# Patient Record
Sex: Male | Born: 1950 | Race: White | Hispanic: No | Marital: Married | Smoking: Never smoker
Health system: Southern US, Community
[De-identification: ages and names within clinical notes are randomized; demographics above are authoritative.]

## PROBLEM LIST (undated history)

## (undated) DIAGNOSIS — M109 Gout, unspecified: Secondary | ICD-10-CM

## (undated) DIAGNOSIS — I10 Essential (primary) hypertension: Secondary | ICD-10-CM

## (undated) DIAGNOSIS — M1990 Unspecified osteoarthritis, unspecified site: Secondary | ICD-10-CM

## (undated) DIAGNOSIS — Z87442 Personal history of urinary calculi: Secondary | ICD-10-CM

## (undated) HISTORY — PX: APPENDECTOMY: SHX54

## (undated) HISTORY — PX: TONSILLECTOMY: SUR1361

## (undated) HISTORY — PX: NISSEN FUNDOPLICATION: SHX2091

## (undated) HISTORY — PX: HERNIA REPAIR: SHX51

---

## 1998-11-17 HISTORY — PX: DG CALCANEOUS HEEL LEFT (ARMC HX): HXRAD1465

## 2019-05-05 ENCOUNTER — Other Ambulatory Visit: Payer: Self-pay

## 2019-05-05 ENCOUNTER — Ambulatory Visit
Admission: EM | Admit: 2019-05-05 | Discharge: 2019-05-05 | Disposition: A | Discharge status: Home / Self Care | Payer: Medicare Other | Attending: Emergency Medicine | Admitting: Emergency Medicine

## 2019-05-05 DIAGNOSIS — N200 Calculus of kidney: Secondary | ICD-10-CM | POA: Diagnosis not present

## 2019-05-05 DIAGNOSIS — R030 Elevated blood-pressure reading, without diagnosis of hypertension: Secondary | ICD-10-CM

## 2019-05-05 DIAGNOSIS — R109 Unspecified abdominal pain: Secondary | ICD-10-CM | POA: Diagnosis not present

## 2019-05-05 HISTORY — DX: Essential (primary) hypertension: I10

## 2019-05-05 LAB — POCT URINALYSIS DIP (MANUAL ENTRY)
Bilirubin, UA: NEGATIVE
Glucose, UA: NEGATIVE mg/dL
Ketones, POC UA: NEGATIVE mg/dL
Leukocytes, UA: NEGATIVE
Nitrite, UA: NEGATIVE
Protein Ur, POC: NEGATIVE mg/dL
Spec Grav, UA: 1.015 (ref 1.010–1.025)
Urobilinogen, UA: 0.2 E.U./dL
pH, UA: 7 (ref 5.0–8.0)

## 2019-05-05 MED ORDER — TRAMADOL HCL 50 MG PO TABS: 50.0000 mg | ORAL_TABLET | Freq: Two times a day (BID) | ORAL | 0 refills | Status: AC | PRN

## 2019-05-05 MED ORDER — ONDANSETRON HCL 4 MG PO TABS: 4.0000 mg | ORAL_TABLET | Freq: Four times a day (QID) | ORAL | 0 refills | Status: AC

## 2019-05-05 MED ORDER — KETOROLAC TROMETHAMINE 60 MG/2ML IM SOLN
60.0000 mg | Freq: Once | INTRAMUSCULAR | Status: AC
  Administered 2019-05-05: 60 mg via INTRAMUSCULAR

## 2019-05-05 MED ORDER — IBUPROFEN 800 MG PO TABS: 800.0000 mg | ORAL_TABLET | Freq: Two times a day (BID) | ORAL | 0 refills | Status: AC | PRN

## 2019-05-05 NOTE — ED Provider Notes (Addendum)
[facility]   CC: Flank pain  SUBJECTIVE:  Tyler Steele is a 68 y.o. male hx significant for HTN, who complains of left sided flank pain that began this morning.  Admits to decreased water intake over night while working.  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  Localizes the pain to the left flank.  Pain is constant and dull, and intermittently sharp.  Has tried ibuprofen 600 mg 2-3 hours ago without relief. Pain is 10/10.  Admits to similar symptoms in the past related to kidney stones.  Complains of 1 episode of nausea, and vomiting that occurred this morning secondary to pain.  Denies fever, chills, abdominal pain, hematuria, dysuria, oligoria, changes in bowel movements.    Last BM this morning and normal for patient.    LMP: No LMP for male patient.  ROS: As in HPI.  Past Medical History:  Diagnosis Date  . Hypertension    Past Surgical History:  Procedure Laterality Date  . APPENDECTOMY    . HERNIA REPAIR    . NISSEN FUNDOPLICATION    . TONSILLECTOMY     Allergies  Allergen Reactions  . Penicillins    No current facility-administered medications on file prior to encounter.    Current Outpatient Medications on File Prior to Encounter  Medication Sig Dispense Refill  . losartan (COZAAR) 25 MG tablet Take 25 mg by mouth daily.    . ALLOPURINOL PO Take by mouth.    Marland Kitchen. amLODipine (NORVASC) 5 MG tablet Take 5 mg by mouth daily.    Marland Kitchen. DICLOFENAC PO Take by mouth.     Social History   Socioeconomic History  . Marital status: Married    Spouse name: Not on file  . Number of children: Not on file  . Years of education: Not on file  . Highest education level: Not on file  Occupational History  . Not on file  Social Needs  . Financial resource strain: Not on file  . Food insecurity    Worry: Not on file    Inability: Not on file  . Transportation needs    Medical: Not on file    Non-medical: Not on file  Tobacco Use  . Smoking  status: Never Smoker  . Smokeless tobacco: Never Used  Substance and Sexual Activity  . Alcohol use: Not Currently  . Drug use: Never  . Sexual activity: Not on file  Lifestyle  . Physical activity    Days per week: Not on file    Minutes per session: Not on file  . Stress: Not on file  Relationships  . Social Musicianconnections    Talks on phone: Not on file    Gets together: Not on file    Attends religious service: Not on file    Active member of club or organization: Not on file    Attends meetings of clubs or organizations: Not on file    Relationship status: Not on file  . Intimate partner violence    Fear of current or ex partner: Not on file    Emotionally abused: Not on file    Physically abused: Not on file    Forced sexual activity: Not on file  Other Topics Concern  . Not on file  Social History Narrative  . Not on file   History reviewed. No pertinent family history.  OBJECTIVE:  Vitals:   05/05/19 1338  BP: (!) 173/93  Pulse: 74  Resp: 20  Temp:  98.1 F (36.7 C)  SpO2: 96%   General appearance: Alert; appears uncomfortable, but nontoxic HEENT: NCAT.  Oropharynx clear.  Lungs: clear to auscultation bilaterally without adventitious breath sounds Heart: regular rate and rhythm.  Abdomen: soft; non-distended; TTP over left flank; bowel sounds present; + guarding Back: no CVA tenderness Extremities: no edema; symmetrical with no gross deformities Skin: warm and dry; 3 - 4 cm transverse scar over RLQ, healed Neurologic: Ambulates from chair to exam table without difficulty Psychological: alert and cooperative; normal mood and affect  Labs Reviewed  POCT URINALYSIS DIP (MANUAL ENTRY) - Abnormal; Notable for the following components:      Result Value   Blood, UA small (*)    All other components within normal limits   Results for orders placed or performed during the hospital encounter of 05/05/19 (from the past 24 hour(s))  POCT urinalysis dipstick      Status: Abnormal   Collection Time: 05/05/19  2:01 PM  Result Value Ref Range   Color, UA yellow yellow   Clarity, UA clear clear   Glucose, UA negative negative mg/dL   Bilirubin, UA negative negative   Ketones, POC UA negative negative mg/dL   Spec Grav, UA 1.015 1.010 - 1.025   Blood, UA small (A) negative   pH, UA 7.0 5.0 - 8.0   Protein Ur, POC negative negative mg/dL   Urobilinogen, UA 0.2 0.2 or 1.0 E.U./dL   Nitrite, UA Negative Negative   Leukocytes, UA Negative Negative     ASSESSMENT & PLAN:  1. Kidney stone   2. Left flank pain   3. Elevated blood pressure reading     Meds ordered this encounter  Medications  . ketorolac (TORADOL) injection 60 mg  . ibuprofen (ADVIL) 800 MG tablet    Sig: Take 1 tablet (800 mg total) by mouth 2 (two) times daily as needed for moderate pain.    Dispense:  30 tablet    Refill:  0    Order Specific Question:   Supervising Provider    Answer:   Raylene Everts [[id number]]  . ondansetron (ZOFRAN) 4 MG tablet    Sig: Take 1 tablet (4 mg total) by mouth every 6 (six) hours.    Dispense:  12 tablet    Refill:  0    Order Specific Question:   Supervising Provider    Answer:   Raylene Everts [[id number]]  . traMADol (ULTRAM) 50 MG tablet    Sig: Take 1 tablet (50 mg total) by mouth every 12 (twelve) hours as needed for severe pain.    Dispense:  10 tablet    Refill:  0    Order Specific Question:   Supervising Provider    Answer:   Raylene Everts [[id number]]   Urine did not show signs of infection, but did show small blood Toradol shot given in office Push fluids Strain all urine and follow up with PCP with specimen in [place] prescribed.  Use as needed for nausea.  Ibuprofen 800 mg twice daily.  Avoid taking with other antiinflammatories as this may cause GI upset and/or bleed.   Tramadol for severe break-through pain.  This medication may make you drowsy DO NOT TAKE prior to driving or operating heavy machinery  Return or go to ER if you have any new or worsening symptoms (difficulty urinating, blood in urine, pain that does not moderate with medication, fever, chills, nausea, vomiting, abdominal pain, etc...)   Blood pressure elevated  in office.  Please recheck in 24 hours.  If it continues to be greater than 140/90 please follow up with PCP for further evaluation and management.    Outlined signs and symptoms indicating need for more acute intervention. Patient verbalized understanding. After Visit Summary given.     Rennis HardingWurst, Charm Stenner, PA-C 05/05/19 1413    Alvino ChapelWurst, [place]ittany, PA-C 05/05/19 1414

## 2019-05-05 NOTE — Discharge Instructions (Addendum)
Urine did not show signs of infection, but did show small blood Toradol shot given in office Push fluids Strain all urine and follow up with PCP with specimen in [place] prescribed.  Use as needed for nausea.  Ibuprofen 800 mg twice daily.  Avoid taking with other antiinflammatories as this may cause GI upset and/or bleed.   Tramadol for severe break-through pain.  This medication may make you drowsy DO NOT TAKE prior to driving or operating heavy machinery Return or go to ER if you have any new or worsening symptoms (difficulty urinating, blood in urine, pain that does not moderate with medication, fever, chills, nausea, vomiting, abdominal pain, etc...)   Blood pressure elevated in office.  Please recheck in 24 hours.  If it continues to be greater than 140/90 please follow up with PCP for further evaluation and management.

## 2019-05-05 NOTE — ED Triage Notes (Signed)
Pt has left sided flank pain, began this morning, has h/o kidney stones

## 2019-05-08 ENCOUNTER — Encounter (HOSPITAL_COMMUNITY): Payer: Self-pay | Admitting: Emergency Medicine

## 2019-05-08 ENCOUNTER — Emergency Department (HOSPITAL_COMMUNITY)
Admission: EM | Admit: 2019-05-08 | Discharge: 2019-05-08 | Disposition: A | Discharge status: Home / Self Care | Payer: Medicare Other | Attending: Emergency Medicine | Admitting: Emergency Medicine

## 2019-05-08 ENCOUNTER — Emergency Department (HOSPITAL_COMMUNITY): Payer: Medicare Other

## 2019-05-08 ENCOUNTER — Other Ambulatory Visit: Payer: Self-pay

## 2019-05-08 DIAGNOSIS — I10 Essential (primary) hypertension: Secondary | ICD-10-CM | POA: Diagnosis not present

## 2019-05-08 DIAGNOSIS — R109 Unspecified abdominal pain: Secondary | ICD-10-CM

## 2019-05-08 DIAGNOSIS — Z79899 Other long term (current) drug therapy: Secondary | ICD-10-CM | POA: Diagnosis not present

## 2019-05-08 DIAGNOSIS — N200 Calculus of kidney: Secondary | ICD-10-CM | POA: Diagnosis not present

## 2019-05-08 DIAGNOSIS — R1012 Left upper quadrant pain: Secondary | ICD-10-CM | POA: Diagnosis present

## 2019-05-08 LAB — URINALYSIS, ROUTINE W REFLEX MICROSCOPIC
Bilirubin Urine: NEGATIVE
Glucose, UA: NEGATIVE mg/dL
Hgb urine dipstick: NEGATIVE
Ketones, ur: NEGATIVE mg/dL
Leukocytes,Ua: NEGATIVE
Nitrite: NEGATIVE
Protein, ur: NEGATIVE mg/dL
Specific Gravity, Urine: 1.012 (ref 1.005–1.030)
pH: 5 (ref 5.0–8.0)

## 2019-05-08 MED ORDER — DIPHENHYDRAMINE HCL 25 MG PO CAPS
25.0000 mg | ORAL_CAPSULE | Freq: Once | ORAL | Status: AC
  Administered 2019-05-08: 25 mg via ORAL
  Filled 2019-05-08: qty 1

## 2019-05-08 MED ORDER — TAMSULOSIN HCL 0.4 MG PO CAPS: 0.4000 mg | ORAL_CAPSULE | Freq: Every day | ORAL | 0 refills | Status: AC

## 2019-05-08 MED ORDER — OXYCODONE-ACETAMINOPHEN 5-325 MG PO TABS
1.0000 | ORAL_TABLET | Freq: Once | ORAL | Status: AC
  Administered 2019-05-08: 1 via ORAL
  Filled 2019-05-08: qty 1

## 2019-05-08 MED ORDER — OXYCODONE-ACETAMINOPHEN 5-325 MG PO TABS: 1.0000 | ORAL_TABLET | ORAL | 0 refills | Status: AC | PRN

## 2019-05-08 NOTE — Discharge Instructions (Addendum)
Your CT scan showed a 9 mm kidney stone.  You will need to follow-up with your urologist when you return home.  Take a benadryl capsule 25 mg 15-20 minutes before taking your percocet if the percocet causes itching.  Take the flomax as directed.  Return to ER for any worsening symptoms such as difficulty urinating, fever, or increasing pain

## 2019-05-08 NOTE — ED Triage Notes (Signed)
Pain in last 3-4 days, left flank pain.  Seen at Urgent Care, treated with Mortin and Toradol with mild relief.  Last dose of meds was 10:30.

## 2019-05-08 NOTE — ED Notes (Signed)
Pt never looks up from cell phone from which he is texting   Reports "kidney stones" when asked complaint  Reports he is here from [place] on a job and has a kidney stone   Urine collected and to lab

## 2019-05-08 NOTE — ED Notes (Signed)
TT in to reassess pt

## 2019-05-08 NOTE — ED Provider Notes (Signed)
[facility] EMERGENCY DEPARTMENT Provider Note   CSN: [id number] Arrival date & time: 05/08/19  1457     History   Chief Complaint Chief Complaint  Patient presents with  . Flank Pain    left    HPI Ladona MowStevie Dolliver is a 68 y.o. male.     HPI   Ladona MowStevie Shutt is a 68 y.o. male with a hx of HTN and kidney stones, he comes to the Emergency Department complaining of recurrent left flank pain.  He was [facility] at [facility] care on 05/05/2019 for similar symptoms.  He was given injection of Toradol and prescription for 800 mg ibuprofen he states that since taking the medication and receiving the injection his pain has been much improved.  This morning, he states the pain returned.  He describes the pain as constant in his mid to lower left flank area.  Pain is nonradiating.  He took 1 dose of ibuprofen this morning at 1030 with some relief.  He is here working and resides in [place].  He reports history of recurrent kidney stones and states his current pain feels similar.  He denies known injury, fever, chills, dysuria, hematuria, abdominal pain and vomiting.  Pain is not associated with movement.    Past Medical History:  Diagnosis Date  . Hypertension     There are no active problems to display for this patient.   Past Surgical History:  Procedure Laterality Date  . APPENDECTOMY    . HERNIA REPAIR    . NISSEN FUNDOPLICATION    . TONSILLECTOMY        Home Medications    Prior to Admission medications   Medication Sig Start Date End Date Taking? Authorizing Provider  allopurinol (ZYLOPRIM) 300 MG tablet Take 300 mg by mouth daily.    Yes [provider]  atorvastatin (LIPITOR) 20 MG tablet Take 20 mg by mouth every morning.   Yes [provider]  diclofenac (VOLTAREN) 50 MG EC tablet Take 50 mg by mouth daily. *May take twice daily   Yes [provider]  fluticasone (FLONASE) 50 MCG/ACT nasal spray Place 2 sprays into both nostrils 2  (two) times a day.   Yes [provider]  ibuprofen (ADVIL) 800 MG tablet Take 1 tablet (800 mg total) by mouth 2 (two) times daily as needed for moderate pain. 05/05/19  Yes Wurst, [place], PA-C  levocetirizine (XYZAL) 5 MG tablet Take 5 mg by mouth every evening.   Yes [provider]  losartan (COZAAR) 25 MG tablet Take 25 mg by mouth at bedtime.  07/08/18  Yes [provider]  montelukast (SINGULAIR) 10 MG tablet Take 10 mg by mouth at bedtime.   Yes [provider]  ondansetron (ZOFRAN) 4 MG tablet Take 1 tablet (4 mg total) by mouth every 6 (six) hours. 05/05/19  Yes Wurst, [place], PA-C  traMADol (ULTRAM) 50 MG tablet Take 1 tablet (50 mg total) by mouth every 12 (twelve) hours as needed for severe pain. Patient not taking: Reported on 05/08/2019 05/05/19   Rennis HardingWurst, Brittany, PA-C    Family History History reviewed. No pertinent family history.  Social History Social History   Tobacco Use  . Smoking status: Never Smoker  . Smokeless tobacco: Never Used  Substance Use Topics  . Alcohol use: Not Currently  . Drug use: Never     Allergies   Penicillins   Review of Systems Review of Systems  Constitutional: Negative for activity change, appetite change, chills  and fever.  Respiratory: Negative for chest tightness and shortness of breath.   Cardiovascular: Negative for chest pain.  Gastrointestinal: Negative for abdominal pain, nausea and vomiting.  Genitourinary: Positive for flank pain. Negative for decreased urine volume, difficulty urinating, dysuria, frequency, hematuria, penile pain, penile swelling, scrotal swelling, testicular pain and urgency.  Musculoskeletal: Negative for back pain.  Skin: Negative for rash.  Neurological: Negative for dizziness, weakness and numbness.  Hematological: Negative for adenopathy.  Psychiatric/Behavioral: Negative for confusion.     Physical Exam Updated Vital Signs BP (!) 188/85 (BP Location: Right  Arm)   Pulse 80   Temp 98 F (36.7 C) (Oral)   Ht 6\' 3"  (1.905 m)   Wt 106.6 kg   SpO2 95%   BMI 29.37 kg/m   Physical Exam Vitals signs and nursing note reviewed.  Constitutional:      General: He is not in acute distress.    Appearance: Normal appearance. He is not toxic-appearing.     Comments: Pt is comfortable appearing  Neck:     Musculoskeletal: Normal range of motion.     Thyroid: No thyromegaly.  Cardiovascular:     Rate and Rhythm: Normal rate and regular rhythm.     Pulses: Normal pulses.  Pulmonary:     Effort: Pulmonary effort is normal.     Breath sounds: Normal breath sounds. No wheezing.  Chest:     Chest wall: No tenderness.  Abdominal:     Palpations: Abdomen is soft.     Tenderness: There is no abdominal tenderness. There is no right CVA tenderness, left CVA tenderness, guarding or rebound.  Musculoskeletal: Normal range of motion.  Skin:    General: Skin is warm.     Findings: No rash.  Neurological:     General: No focal deficit present.     Mental Status: He is alert.     Sensory: No sensory deficit.     Motor: No weakness.      ED Treatments / Results  Labs (all labs ordered are listed, but only abnormal results are displayed) Labs Reviewed  URINALYSIS, ROUTINE W REFLEX MICROSCOPIC - Abnormal; Notable for the following components:      Result Value   Color, Urine STRAW (*)    All other components within normal limits    EKG    Radiology Ct Renal Stone Study  Result Date: 05/08/2019 CLINICAL DATA:  68 year old male with acute LEFT flank and abdominal pain for 3 days. EXAM: CT ABDOMEN AND PELVIS WITHOUT CONTRAST TECHNIQUE: Multidetector CT imaging of the abdomen and pelvis was performed following the standard protocol without IV contrast. COMPARISON:  None. FINDINGS: Please note that parenchymal abnormalities may be missed without intravenous contrast. Lower chest: No acute abnormality Hepatobiliary: No significant hepatic or gallbladder  abnormalities. No biliary dilatation. Pancreas: Unremarkable Spleen: Unremarkable except for small calcified granulomas Adrenals/Urinary Tract: A 9 mm LEFT UPJ calculus causes moderate LEFT hydronephrosis. A punctate nonobstructing LEFT LOWER pole renal calculus is noted. Multiple nonobstructing RIGHT renal calculi are identified (at least 9) measuring 1-8 mm in size. Probable bilateral renal cysts are noted with a 9 cm cyst extending off of the RIGHT UPPER pole. The adrenal glands and bladder are unremarkable. Stomach/Bowel: Stomach is within normal limits. No evidence of bowel wall thickening, distention, or inflammatory changes. Vascular/Lymphatic: Aortic atherosclerosis. No enlarged abdominal or pelvic lymph nodes. Reproductive: No significant abnormality Other: No free fluid, pneumoperitoneum or focal collection Musculoskeletal: No acute or suspicious bony abnormalities. 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 IMPRESSION: 1. 9 mm LEFT UPJ calculus causing moderate LEFT hydronephrosis. 2. Bilateral nephrolithiasis. 3.  Aortic Atherosclerosis (ICD10-I70.0). Electronically Signed   By: Harmon PierJeffrey  Hu M.D.   On: 05/08/2019 16:18    Procedures Procedures (including critical care time)  Medications Ordered in ED Medications - No data to display   Initial Impression / Assessment and Plan / ED Course  I have reviewed the triage vital signs and the nursing notes.  Pertinent labs & imaging results that were available during my care of the patient were reviewed by me and considered in my medical decision making (see chart for details).       Pt here from out of town with left flank pain and hx of kidney stones.  He has a 9mm stone at the left UPJ.  His pain is fairly controlled here and he prefers to try to manage his pain for the week and follow-up with his urologist when he returns home later in the week.  I will consult urology here.  1645  Consulted Dr.  Ronne BinningMcKenzie and discussed findings and pt request.  He felt that this would be acceptable.  I will prescribe pain medication and flomax.  Pt given strict return precautions and he agrees to plan.     Final Clinical Impressions(s) / ED Diagnoses   Final diagnoses:  Kidney stone  Left flank pain    ED Discharge Orders    None       Rosey Bathriplett, Josette Shimabukuro, PA-C 05/08/19 1715    Vanetta MuldersZackowski, Scott, MD 05/08/19 1904

## 2019-05-08 NOTE — ED Notes (Signed)
Pt was informed we need urine sample. 

## 2019-05-10 ENCOUNTER — Other Ambulatory Visit: Payer: Self-pay | Admitting: Urology

## 2019-05-11 ENCOUNTER — Encounter (HOSPITAL_COMMUNITY): Payer: Self-pay | Admitting: General Practice

## 2019-05-11 NOTE — Progress Notes (Signed)
Covid 19 screen scheduled for 6/26 prior to procedure 6/29 at [facility].

## 2019-05-11 NOTE — Progress Notes (Signed)
Called patient and informed him that he will not require a covid test for his ESWL. He states he will be driving back to his home state and will need to cancell procedure. He is instructed to call [facility] to cancel procedure. Phone number is provided.

## 2019-05-13 ENCOUNTER — Other Ambulatory Visit (HOSPITAL_COMMUNITY): Payer: Medicare Other

## 2019-05-16 ENCOUNTER — Ambulatory Visit (HOSPITAL_COMMUNITY): Admission: RE | Admit: 2019-05-16 | Payer: Medicare Other | Source: Other Acute Inpatient Hospital | Admitting: Urology

## 2019-05-16 HISTORY — DX: Unspecified osteoarthritis, unspecified site: M19.90

## 2019-05-16 HISTORY — DX: Gout, unspecified: M10.9

## 2019-05-16 HISTORY — DX: Personal history of urinary calculi: Z87.442

## 2019-05-16 SURGERY — LITHOTRIPSY, ESWL
Anesthesia: LOCAL | Laterality: Left

## 2020-11-09 IMAGING — CT CT RENAL STONE PROTOCOL
2 of 4 series · 16 of 46 positions shown, 18 images · non-contrast
Comparison: None.

CLINICAL DATA: 67-year-old male with acute LEFT flank and abdominal
pain for 3 days.

EXAM:
CT ABDOMEN AND PELVIS WITHOUT CONTRAST
TECHNIQUE: Multidetector CT imaging of the abdomen and pelvis was performed
following the standard protocol without IV contrast.

[Series 2: axial st · axial · 0.80mm/px · z∈[+942,+1362]mm · 13 of 94 slices shown, 15 images]
[im 5/94  soft-tissue]
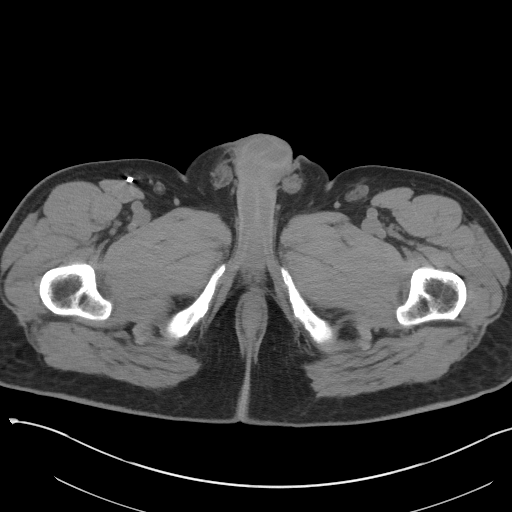
[im 5/94  bone]
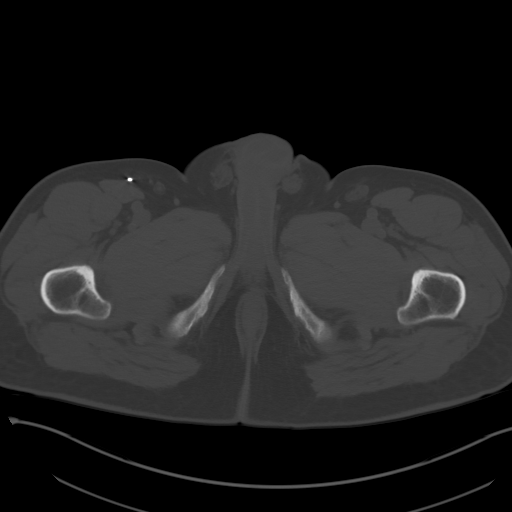
[im 14/94  soft-tissue]
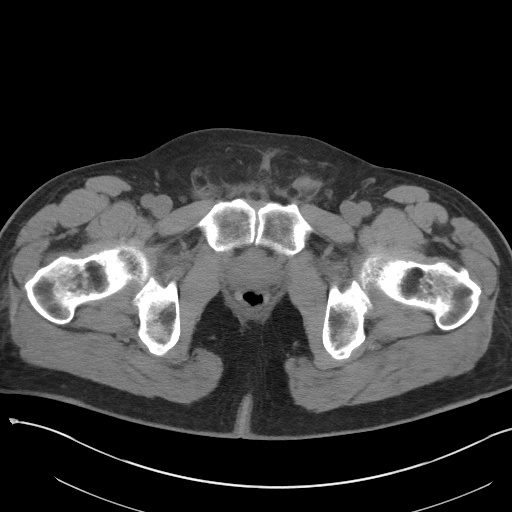
[im 18/94  soft-tissue]
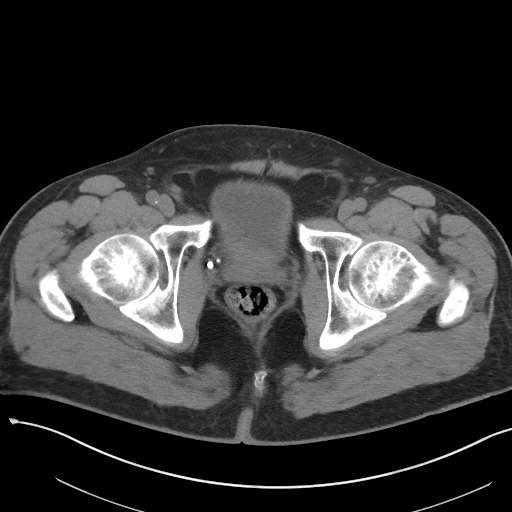
[im 27/94  soft-tissue]
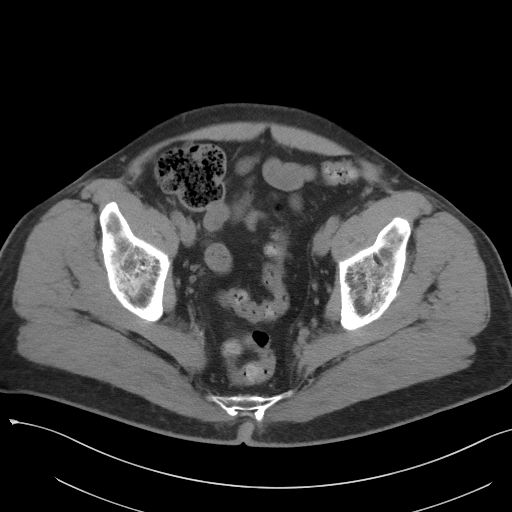
[im 32/94  soft-tissue]
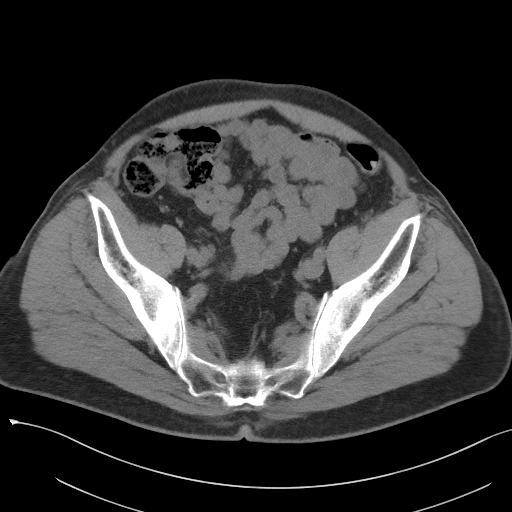
[im 40/94  soft-tissue]
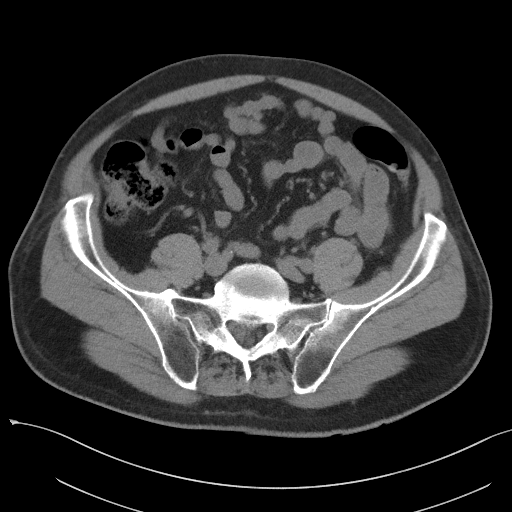
[im 49/94  soft-tissue]
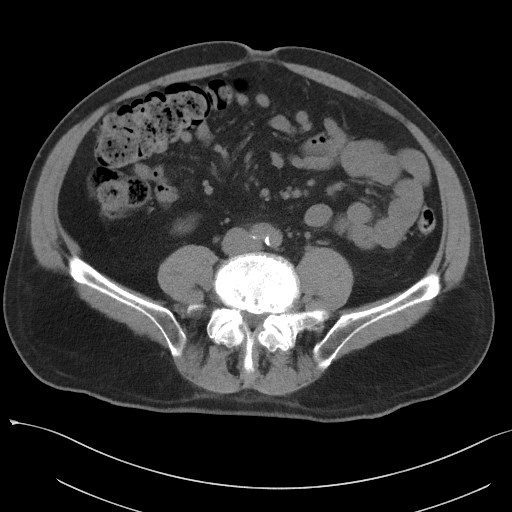
[im 54/94  soft-tissue]
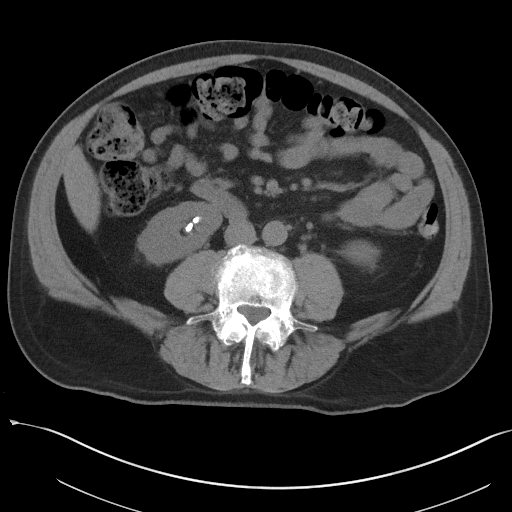
[im 63/94  soft-tissue]
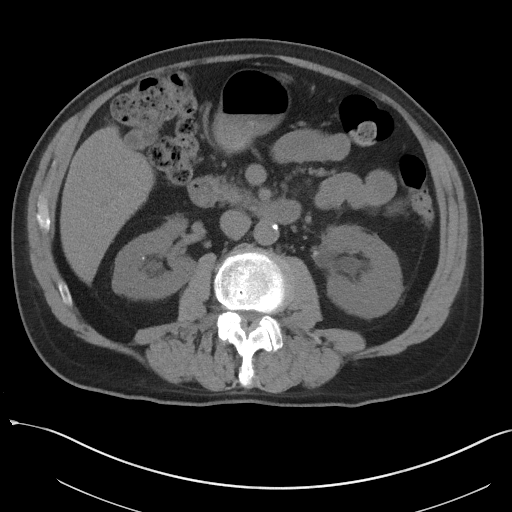
[im 63/94  bone]
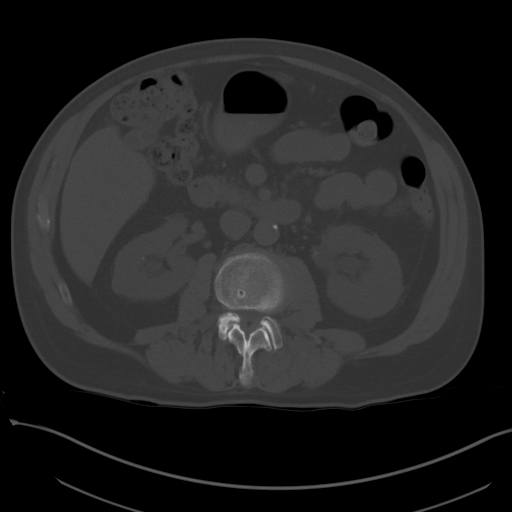
[im 67/94  soft-tissue]
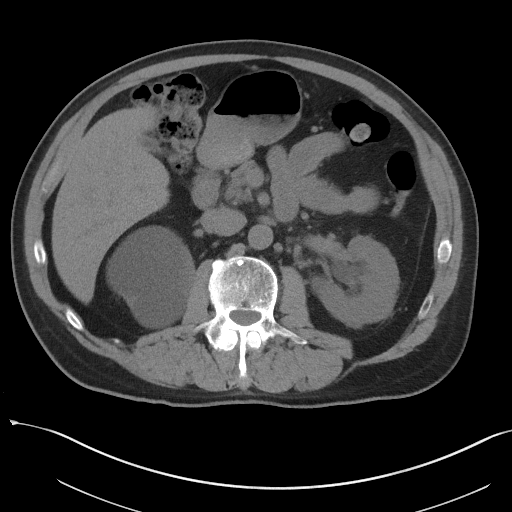
[im 76/94  soft-tissue]
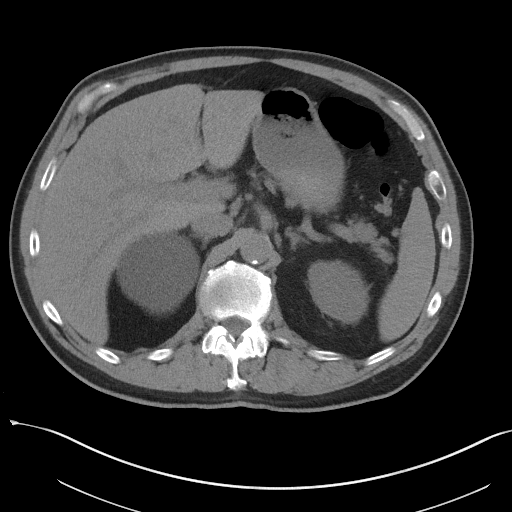
[im 80/94  soft-tissue]
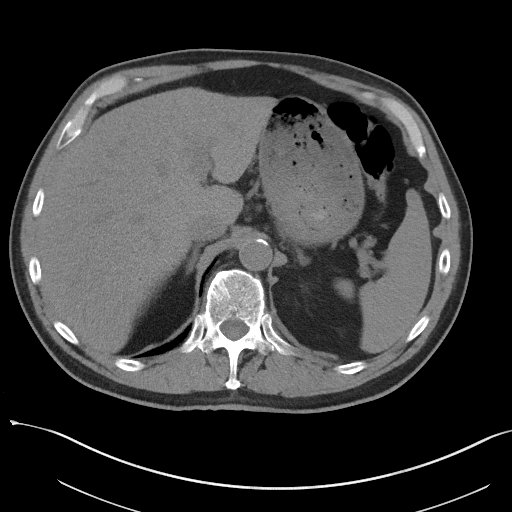
[im 89/94  soft-tissue]
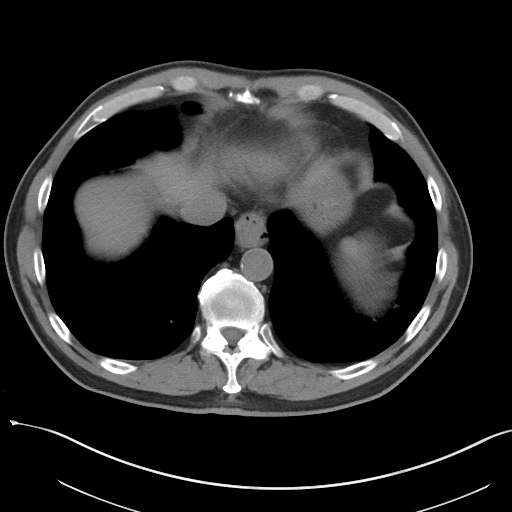

[Series 5: coronal st · coronal · 0.79mm/px · 3 of 105 slices shown]
[im 35/105  soft-tissue]
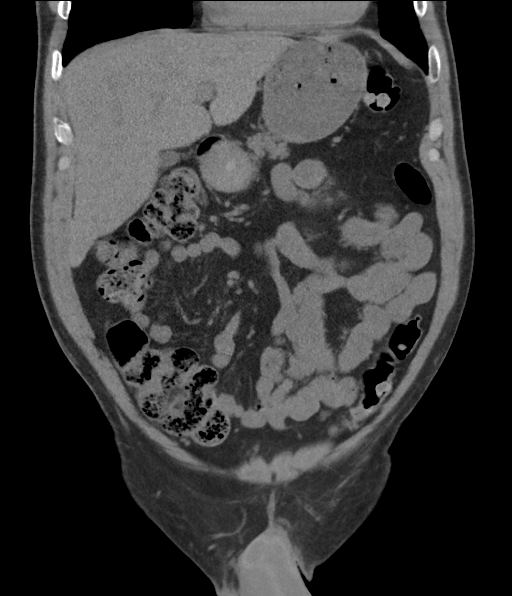
[im 47/105  soft-tissue]
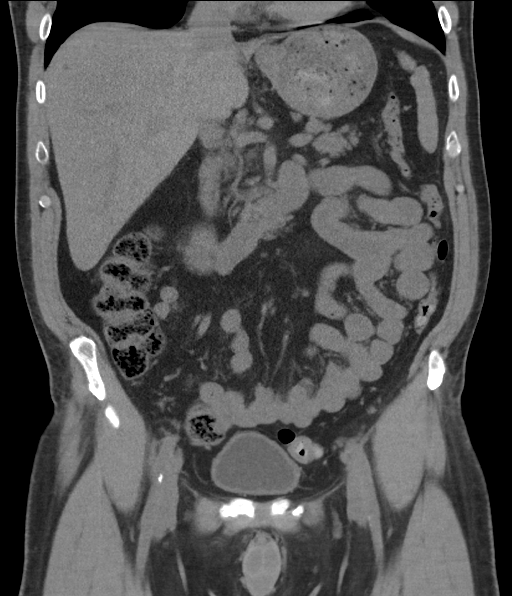
[im 58/105  soft-tissue]
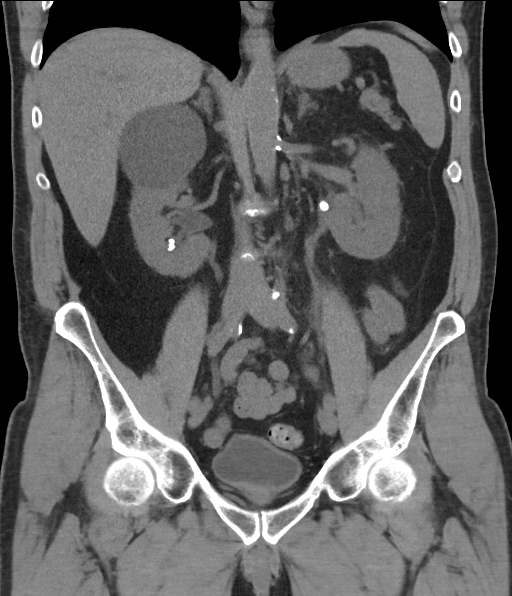

[16 of 46 positions shown; findings below may reference images not displayed]

FINDINGS: Please note that parenchymal abnormalities may be missed without
intravenous contrast.

Lower chest: No acute abnormality

Hepatobiliary: No significant hepatic or gallbladder abnormalities.
No biliary dilatation.

Pancreas: Unremarkable

Spleen: Unremarkable except for small calcified granulomas

Adrenals/Urinary Tract: A 9 mm LEFT UPJ calculus causes moderate
LEFT hydronephrosis. A punctate nonobstructing LEFT LOWER pole renal
calculus is noted.

Multiple nonobstructing RIGHT renal calculi are identified (at least
9) measuring 1-8 mm in size.

Probable bilateral renal cysts are noted with a 9 cm cyst extending
off of the RIGHT UPPER pole.

The adrenal glands and bladder are unremarkable.

Stomach/Bowel: Stomach is within normal limits. No evidence of bowel
wall thickening, distention, or inflammatory changes.

Vascular/Lymphatic: Aortic atherosclerosis. No enlarged abdominal or
pelvic lymph nodes.

Reproductive: No significant abnormality

Other: No free fluid, pneumoperitoneum or focal collection

Musculoskeletal: No acute or suspicious bony abnormalities. Mild to
moderate degenerative disc disease/spondylosis in the lumbar spine
noted.
IMPRESSION: 1. 9 mm LEFT UPJ calculus causing moderate LEFT hydronephrosis.
2. Bilateral nephrolithiasis.
3.  Aortic Atherosclerosis ([id number]).
